# Patient Record
Sex: Female | Born: 1995 | Race: White | Hispanic: No | Marital: Single | Smoking: Never smoker
Health system: Southern US, Community
[De-identification: ages and names within clinical notes are randomized; demographics above are authoritative.]

## PROBLEM LIST (undated history)

## (undated) DIAGNOSIS — G43909 Migraine, unspecified, not intractable, without status migrainosus: Secondary | ICD-10-CM

## (undated) DIAGNOSIS — F909 Attention-deficit hyperactivity disorder, unspecified type: Secondary | ICD-10-CM

## (undated) HISTORY — PX: KNEE SURGERY: SHX244

---
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
## 2018-01-28 DIAGNOSIS — G43719 Chronic migraine without aura, intractable, without status migrainosus: Secondary | ICD-10-CM | POA: Diagnosis not present

## 2018-01-28 DIAGNOSIS — Z0000 Encounter for general adult medical examination without abnormal findings: Secondary | ICD-10-CM | POA: Diagnosis not present

## 2018-05-19 DIAGNOSIS — Z020 Encounter for examination for admission to educational institution: Secondary | ICD-10-CM | POA: Diagnosis not present

## 2019-01-16 DIAGNOSIS — J029 Acute pharyngitis, unspecified: Secondary | ICD-10-CM | POA: Diagnosis not present

## 2019-03-28 DIAGNOSIS — Z0000 Encounter for general adult medical examination without abnormal findings: Secondary | ICD-10-CM | POA: Diagnosis not present

## 2019-03-28 DIAGNOSIS — Z3045 Encounter for surveillance of transdermal patch hormonal contraceptive device: Secondary | ICD-10-CM | POA: Diagnosis not present

## 2019-12-10 ENCOUNTER — Other Ambulatory Visit: Payer: Self-pay

## 2019-12-10 ENCOUNTER — Ambulatory Visit (INDEPENDENT_AMBULATORY_CARE_PROVIDER_SITE_OTHER): Payer: No Typology Code available for payment source

## 2019-12-10 ENCOUNTER — Ambulatory Visit
Admission: EM | Admit: 2019-12-10 | Discharge: 2019-12-10 | Disposition: A | Discharge status: Home / Self Care | Payer: No Typology Code available for payment source | Attending: Family Medicine | Admitting: Family Medicine

## 2019-12-10 DIAGNOSIS — S9031XA Contusion of right foot, initial encounter: Secondary | ICD-10-CM

## 2019-12-10 DIAGNOSIS — M79671 Pain in right foot: Secondary | ICD-10-CM

## 2019-12-10 DIAGNOSIS — W208XXA Other cause of strike by thrown, projected or falling object, initial encounter: Secondary | ICD-10-CM

## 2019-12-10 HISTORY — DX: Attention-deficit hyperactivity disorder, unspecified type: F90.9

## 2019-12-10 NOTE — ED Triage Notes (Signed)
Pt presents with right foot pain. She states she dropped an insulated cup and it hit the middle top of her foot. There is a small abrasion with some bruising. She did apply ice with some improvement. She has full ROM in the foot.

## 2019-12-10 NOTE — Discharge Instructions (Addendum)
Recommend take OTC Aleve 1 to 2 tablets every 12 hours as needed for pain. Keep foot elevated and may wear ace wrap for support. Follow-up in 3 to 4 days if not improving.

## 2019-12-10 NOTE — ED Provider Notes (Signed)
[facility]    CSN: [id number] Arrival date & time: 12/10/19  1223      History   Chief Complaint Chief Complaint  Patient presents with  . Foot Pain    right    HPI Kelsey Potter is a 24 y.o. female.   24 year old female presents with injury to her right foot. She dropped an insulated heavy cup on the top of her foot today. Experienced immediate pain, small abrasion and bruising. Applied ice which has helped some with swelling but still concerned over pain and possibility of fracture. Having some tingling of the foot which is radiating to the toes. No previous injury to her foot- has fractured toes in her right foot in the past. Has not taken any medication for pain yet. Other chronic health issues include ADD and migraine headaches and currently on Ritalin and birth control patch daily and Imitrex prn.   The history is provided by the patient.    Past Medical History:  Diagnosis Date  . ADHD   . Migraines     There are no problems to display for this patient.   Past Surgical History:  Procedure Laterality Date  . KNEE SURGERY Left     OB History   No obstetric history on file.      Home Medications    Prior to Admission medications   Medication Sig Start Date End Date Taking? Authorizing Provider  methylphenidate (RITALIN) 10 MG tablet Take 10 mg by mouth 2 (two) times daily.   Yes [provider]  norelgestromin-ethinyl estradiol [employer]) 150-35 MCG/24HR transdermal patch Place 1 patch onto the skin once a week.   Yes [provider]  SUMAtriptan (IMITREX) 25 MG tablet Take 25 mg by mouth every 2 (two) hours as needed for migraine. May repeat in 2 hours if headache persists or recurs.   Yes [provider]  propranolol (INDERAL) 10 MG tablet Take 10 mg by mouth 3 (three) times daily.  12/10/19  [provider]    Family History Family History  Problem Relation Age of Onset  . Epilepsy Mother   . Diabetes  Father   . Hypertension Father     Social History Social History   Tobacco Use  . Smoking status: Never Smoker  . Smokeless tobacco: Never Used  Substance Use Topics  . Alcohol use: No  . Drug use: Not on file     Allergies   Patient has no known allergies.   Review of Systems Review of Systems  Constitutional: Negative for activity change, appetite change, chills, fatigue and fever.  Gastrointestinal: Negative for nausea and vomiting.  Musculoskeletal: Positive for arthralgias. Negative for gait problem and joint swelling.  Skin: Positive for color change. Negative for wound.  Allergic/Immunologic: Negative for environmental allergies, food allergies and immunocompromised state.  Neurological: Positive for headaches (occasion migraine). Negative for dizziness, tremors, seizures, syncope, weakness, light-headedness and numbness.  Hematological: Negative for adenopathy. Does not bruise/bleed easily.     Physical Exam Triage Vital Signs ED Triage Vitals  Enc Vitals Group     BP 12/10/19 1246 122/78     Pulse Rate 12/10/19 1246 69     Resp --      Temp 12/10/19 1246 98.8 F (37.1 C)     Temp Source 12/10/19 1246 Oral     SpO2 12/10/19 1246 98 %     Weight --      Height 12/10/19 1242 5\' 6"  (1.676 m)  Head Circumference --      Peak Flow --      Pain Score 12/10/19 1241 5     Pain Loc --      Pain Edu? --      Excl. in GC? --    No data found.  Updated Vital Signs BP 122/78 (BP Location: Left Arm)   Pulse 69   Temp 98.8 F (37.1 C) (Oral)   Ht 5\' 6"  (1.676 m)   LMP 11/27/2019   SpO2 98%   BMI 21.79 kg/m   Visual Acuity Right Eye Distance:   Left Eye Distance:   Bilateral Distance:    Right Eye Near:   Left Eye Near:    Bilateral Near:     Physical Exam Vitals and nursing note reviewed.  Constitutional:      General: She is awake. She is not in acute distress.    Appearance: She is well-developed and well-groomed.     Comments: Patient  sitting comfortably in exam chair in no acute distress.   HENT:     Head: Normocephalic and atraumatic.  Cardiovascular:     Rate and Rhythm: Normal rate.     Pulses:          Dorsalis pedis pulses are 3+ on the right side.       Posterior tibial pulses are 3+ on the right side.  Pulmonary:     Effort: Pulmonary effort is normal.  Musculoskeletal:        General: Swelling and tenderness present. Normal range of motion.     Right foot: Normal range of motion. No deformity.     Left foot: Normal range of motion. No deformity.       Feet:  Feet:     Right foot:     Skin integrity: No erythema, warmth or dry skin.     Toenail Condition: Right toenails are normal.     Comments: Right foot with small pink to red abrasion present on mid-tarsal area. Slight ecchymosis and swelling present. Tender to palpation mainly of mid plantar area- minimal tenderness of dorsal area. Has full range of motion of foot. Good sensation and pulses. No neuro deficits noted.  Skin:    General: Skin is warm and dry.     Capillary Refill: Capillary refill takes less than 2 seconds.     Findings: Abrasion (38mm - small- no bleeding present), bruising (slight) and signs of injury present. No erythema, laceration, petechiae or rash.  Neurological:     General: No focal deficit present.     Mental Status: She is alert and oriented to person, place, and time.     Sensory: Sensation is intact. No sensory deficit.     Motor: Motor function is intact.     Coordination: Coordination is intact.     Gait: Gait is intact.     Comments: Able to walk on right foot but some pain with pressure on plantar area of foot.   Psychiatric:        Mood and Affect: Mood normal.        Behavior: Behavior normal. Behavior is cooperative.        Thought Content: Thought content normal.        Judgment: Judgment normal.      UC Treatments / Results  Labs (all labs ordered are listed, but only abnormal results are displayed) Labs  Reviewed - No data to display  EKG   Radiology DG Foot Complete Right  Result Date: 12/10/2019 CLINICAL DATA:  Dropped insulated cup hitting the middle top of the foot, now with medial sided foot pain. EXAM: RIGHT FOOT COMPLETE - 3+ VIEW COMPARISON:  None. FINDINGS: Mild soft tissue swelling about the dorsal medial aspect of the mid foot without associated fracture or dislocation. No radiopaque foreign body. Joint spaces are preserved. No hallux valgus deformity. No erosions. No plantar calcaneal spur. IMPRESSION: Mild soft tissue swelling about the medial dorsal aspect of the mid foot without associated fracture, dislocation or radiopaque foreign body. Electronically Signed   By: Sandi Mariscal M.D.   On: 12/10/2019 13:58    Procedures Procedures (including critical care time)  Medications Ordered in UC Medications - No data to display  Initial Impression / Assessment and Plan / UC Course  I have reviewed the triage vital signs and the nursing notes.  Pertinent labs & imaging results that were available during my care of the patient were reviewed by me and considered in my medical decision making (see chart for details).    Reviewed x-ray results with patient- no fracture. Soft tissue swelling present. Recommend wear ace wrap for support and keep foot elevated as much as possible for the next 48 hours. May take OTC Aleve 1 to 2 tablets every 12 hours as needed for pain. Follow-up in 3 to 4 days if not improving.   Final Clinical Impressions(s) / UC Diagnoses   Final diagnoses:  Contusion of right foot, initial encounter  Foot pain, right     Discharge Instructions     Recommend take OTC Aleve 1 to 2 tablets every 12 hours as needed for pain. Keep foot elevated and may wear ace wrap for support. Follow-up in 3 to 4 days if not improving.     ED Prescriptions    None     PDMP not reviewed this encounter.   Katy Apo, NP 12/11/19 1159
